# Patient Record
Sex: Female | Born: 1975 | Race: White | Hispanic: No | Marital: Married | Smoking: Former smoker
Health system: Southern US, Community
[De-identification: ages and names within clinical notes are randomized; demographics above are authoritative.]

---

## 2003-06-18 ENCOUNTER — Other Ambulatory Visit: Admission: RE | Admit: 2003-06-18 | Discharge: 2003-06-18 | Payer: Self-pay | Admitting: Family Medicine

## 2005-07-12 ENCOUNTER — Other Ambulatory Visit: Admission: RE | Admit: 2005-07-12 | Discharge: 2005-07-12 | Payer: Self-pay | Admitting: Obstetrics and Gynecology

## 2006-03-07 ENCOUNTER — Encounter (INDEPENDENT_AMBULATORY_CARE_PROVIDER_SITE_OTHER): Payer: Self-pay | Admitting: Specialist

## 2006-03-07 ENCOUNTER — Inpatient Hospital Stay (HOSPITAL_COMMUNITY): Admission: AD | Admit: 2006-03-07 | Discharge: 2006-03-09 | Payer: Self-pay | Admitting: Obstetrics and Gynecology

## 2006-07-17 ENCOUNTER — Other Ambulatory Visit: Admission: RE | Admit: 2006-07-17 | Discharge: 2006-07-17 | Payer: Self-pay | Admitting: Obstetrics and Gynecology

## 2007-07-22 ENCOUNTER — Ambulatory Visit (HOSPITAL_COMMUNITY): Admission: RE | Admit: 2007-07-22 | Discharge: 2007-07-22 | Payer: Self-pay | Admitting: Obstetrics and Gynecology

## 2011-04-20 NOTE — H&P (Signed)
NAMEARLESIA, Kathryn Schaefer NO.:  [id number]   MEDICAL RECORD NO.:  [id number]          PATIENT TYPE:  MAT   LOCATION:  MATC                          [facility]:  [facility]   PHYSICIAN:  Osborn Coho, M.D.   DATE OF BIRTH:  1976-06-04   DATE OF ADMISSION:  03/07/2006  DATE OF DISCHARGE:                                HISTORY & PHYSICAL   This is a 35 year old gravida 1, para 0 at 40-1/7 weeks who presents with  painful contractions for several hours.  She reports Pelzel blood show and  positive fetal movement.  Pregnancy has been followed by the M.D. 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   OB HISTORY:  Patient is a primigravida.   ALLERGIES:  None except for MILK.   MEDICAL HISTORY:  1.  Patient had an abnormal Pap in 1999 followed by normal ones.  2.  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  3.  History of anemia.  4.  History of depression for which she uses Zoloft.   SURGICAL HISTORY:  Remarkable for repair of a cut on her left foot.   FAMILY HISTORY:  Remarkable for grandfather with heart disease and  hypertension.  Aunt with hypertension.  Mother with varicose veins.  Aunt  with anemia.  Grandfather with emphysema.  Grandmother with diabetes.   GENETIC HISTORY:  Remarkable for a niece of the father with Turner's  syndrome.   SOCIAL HISTORY:  Patient is married to [employer] who is involved and  supportive.  She is of the [place] faith.  She denies any alcohol,  tobacco, or drug use.   PRENATAL LABORATORIES:  Hemoglobin 14.7, platelets 266.  Blood type O+.  Antibody screen negative.  RPR nonreactive.  Rubella immune.  Hepatitis  negative.  HIV negative.  Pap test normal.  Gonorrhea negative.  Chlamydia  negative.  HSV-I positive.  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  Cystic fibrosis negative.   HISTORY OF CURRENT PREGNANCY:  Patient entered care at 9 weeks.  She had an  ultrasound to confirm dates at that time.  She had an anatomy ultrasound at  19 weeks which was normal except for bilateral pyelectasis and toxo titers  were negative.  She had a normal Glucola.  She had an ultrasound at 35 weeks  showing resolved pyelectasis and 88th percentile growth.  Group B Strep was  negative at term.   OBJECTIVE:  VITAL SIGNS:  Stable, afebrile.  HEENT:  Within normal limits.  Thyroid normal, not enlarged.  CHEST:  Clear to auscultation.  HEART:  Regular rate and rhythm.  ABDOMEN:  Gravid at 40 cm.  Vertex to Leopold's.  EFM shows reactive fetal  heart rate with contractions every one and a half minutes.  There have been  two variable decelerations with contractions to the 120s.  PELVIC:  Cervix is 3, 90, -1, vertex, and bulging membranes.  EXTREMITIES:  Within normal limits.   ASSESSMENT:  1.  Intrauterine pregnancy at 40-1/7 weeks.  2.  Early active labor.  3.  Variable decelerations.   PLAN:  1.  Admit to birthing suites.  Dr. Su Hilt notified.  2.  Routine M.D. orders.  3.  Desires epidural.      Elby Showers. Williams, C.N.M.      Osborn Coho, M.D.  Electronically Signed    MLW/MEDQ  D:  03/07/2006  T:  03/07/2006  Job:  [id number]

## 2011-04-20 NOTE — Discharge Summary (Signed)
[name], [name]             ACCOUNT NO.:  [id number]   MEDICAL RECORD NO.:  [id number]          PATIENT TYPE:  INP   LOCATION:  9147                          [facility]  [facility]   PHYSICIAN:  Naima A. Dillard, M.D. DATE OF BIRTH:  [birth year]   DATE OF ADMISSION:  03/07/2006  DATE OF DISCHARGE:                                 DISCHARGE SUMMARY   ADMISSION DIAGNOSES:  1.  Intrauterine pregnancy at term.  2.  Early active labor.  3.  Variable decelerations.   DISCHARGE DIAGNOSES:  1.  Intrauterine pregnancy at term.  2.  Early active labor.  3.  Variable decelerations.  4.  Nonreassuring fetal heart tones.   PROCEDURE:  Primary low-transverse cesarean section.   HOSPITAL COURSE:  Ms. Kathryn Schaefer is a 35 year old primigravida who was admitted  at 40-1/7 weeks with contractions. The pregnancy has been followed by  [facility] MD service and had been remarkable for:  1.  Unsure last menstrual period.  2.  History of depression.  3.  Toxo risk.  4.  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  5.  Bilateral pyelectasis, now resolved.  6.  B-strep negative.   Upon admission her cervix was 3 cm, 90%, vertex -1 with intact membranes.  Her fetal heart rate was tracing was reactive, but she had had a variable  decelerations during the contractions. She was admitted and given an  epidural for pain. Her membranes were ruptured at approximately 2 p.m. that  afternoon when her cervix was 5 cm. There was thick particulate meconium  stained fluid. She continued to have variable decelerations. Her blood  pressure proceeded to drop and she was given ephedrine and fluid bolus. Her  vaginal exam was unchanged. She then proceeded to have recurrent late  decelerations and due to the fetal heart rate tracing a C-section was  recommended by Dr. Su Hilt. The patient and her husband were in agreement  and elected to proceed. The patient was a viable female infant by the name  of Kathryn Schaefer, with a weight of 7  pounds 14 ounces and Apgars of 9 and 9. The  infant was doing well and was taken to the full-term nursery in good  condition. The patient tolerated the rest of the cesarean section well and  was taken to the recovery room. By postoperative day one, her vital signs  were stable, her hemoglobin was 9.6 and had been 13.0 preoperatively. She  was breastfeeding and also supplementing occasionally with a bottle. By  postoperative day she was feeling well and desired to go home. The infant  was discharged from the nursery, breast feeding going well. Her vital signs  were stable.   DISCHARGE INSTRUCTIONS:  Per [facility] handout.   DISCHARGE MEDICATIONS:  1.  Motrin 600 mg one p.o. q.6h. p.r.n. pain.  2.  Tylox one to two p.o. q.3-4h. p.r.n. pain.   DISCHARGE FOLLOWUP:  [facility] in six weeks or as needed.      Kathryn Schaefer, C.N.M.      Naima A. Normand Sloop, M.D.  Electronically Signed    KS/MEDQ  D:  03/09/2006  T:  03/09/2006  Job:  [id number]

## 2011-04-20 NOTE — Op Note (Signed)
Kathryn Schaefer, Kathryn Schaefer             ACCOUNT NO.:  [id number]   MEDICAL RECORD NO.:  [id number]          PATIENT TYPE:  INP   LOCATION:  9147                          [facility]   PHYSICIAN:  Osborn Coho, M.D.   DATE OF BIRTH:  1976/10/01   DATE OF PROCEDURE:  03/07/2006  DATE OF DISCHARGE:                                 OPERATIVE REPORT   PREOP DIAGNOSES:  1.  Term intrauterine pregnancy.  2.  Labor.  3.  Nonreassuring fetal heart tracing.   POSTOPERATIVE DIAGNOSES:  1.  Term intrauterine pregnancy.  2.  Labor.  3.  Nonreassuring fetal heart tracing.   PROCEDURE:  Primary low transverse C-section via Pfannenstiel skin incision.   ANESTHESIA:  Epidural.   ATTENDING:  Dr. Osborn Coho.   ASSISTANT:  Wynelle Bourgeois, C.N.M.   FLUIDS:  2500 mL.   ESTIMATED BLOOD LOSS:  600 mL   URINE OUTPUT:  500 mL of clear urine.   FINDINGS:  Live female infant with Apgars of 9 at one minute and 9 at five  minutes in [place].  Normal-appearing bilateral ovaries and  fallopian tubes.   COMPLICATIONS:  None.   PROCEDURE:  The patient was taken to the operating room after risks,  benefits, alternatives were reviewed with the patient.  The patient  verbalized understanding and consent signed and witnessed.  The patient was  prepped and draped in the normal sterile fashion and a Pfannenstiel skin  incision made and carried out to the underlying layer of fascia with  scalpel.  The fascia was excised bilaterally in the midline and extended  bilaterally with the Mayo scissors.  Kocher clamps were placed on the  superior aspect of fascial incision and the rectus muscle excised from the  fascia.  The same was done on the inferior aspect of the fascial incision.  The muscles separated in midline and the peritoneum entered bluntly.  Bladder blade was placed.  The bladder flap created with the Metzenbaum  scissors.  Uterine incision was made with a scalpel and extended bilaterally  with the bandage scissors.  Infant was delivered without difficulty and upon  delivery of the head in vertex presentation, the oropharynx and nasopharynx  were DeLee suctioned.  A body cord with approximated to one and a half turns  around the body was noted.  The infant was handed to the waiting  pediatricians after cord clamped and cut and cefoxitin was administered.  The placenta was removed via fundal massage and the uterus cleared of all  clots and debris.  The uterine incision was repaired with 0 Vicryl in a  running locked fashion and a second imbricating layer was performed.  The  intra-abdominal cavity was copiously irrigated and adnexal findings as noted  above.  The peritoneum was repaired with 2-0 chromic in a running fashion  and the fascia was repaired with 0 Vicryl in a running fashion.  The  subcutaneous tissue was irrigated and made hemostatic with the Bovie.  The  subcutaneous tissue was reapproximated using 2-0 plain via four interrupted  stitches.  The skin was closed with 3-0 Monocryl  via a subcuticular stitch.  Half inch Steri-Strips were applied.  Pressure dressing applied as well.  Sponge, lap and needle count was correct.  The patient tolerated procedure  well and is currently awaiting transfer to the recovery room in good  condition.      Osborn Coho, M.D.  Electronically Signed     AR/MEDQ  D:  03/07/2006  T:  03/08/2006  Job:  [id number]

## 2013-11-12 ENCOUNTER — Other Ambulatory Visit: Payer: Self-pay | Admitting: Nurse Practitioner

## 2013-11-12 DIAGNOSIS — E049 Nontoxic goiter, unspecified: Secondary | ICD-10-CM

## 2013-11-13 ENCOUNTER — Ambulatory Visit
Admission: RE | Admit: 2013-11-13 | Discharge: 2013-11-13 | Disposition: A | Discharge status: Home / Self Care | Payer: 59 | Source: Ambulatory Visit | Attending: Nurse Practitioner | Admitting: Nurse Practitioner

## 2013-11-13 DIAGNOSIS — E049 Nontoxic goiter, unspecified: Secondary | ICD-10-CM

## 2013-11-17 ENCOUNTER — Other Ambulatory Visit: Payer: Self-pay

## 2014-04-07 ENCOUNTER — Ambulatory Visit (INDEPENDENT_AMBULATORY_CARE_PROVIDER_SITE_OTHER): Payer: 59 | Admitting: Endocrinology

## 2014-04-07 ENCOUNTER — Encounter: Payer: Self-pay | Admitting: Endocrinology

## 2014-04-07 VITALS — BP 112/76 | HR 97 | Temp 98.5°F | Ht 68.5 in | Wt 191.0 lb

## 2014-04-07 DIAGNOSIS — E042 Nontoxic multinodular goiter: Secondary | ICD-10-CM

## 2014-04-07 DIAGNOSIS — E282 Polycystic ovarian syndrome: Secondary | ICD-10-CM

## 2014-04-07 DIAGNOSIS — E079 Disorder of thyroid, unspecified: Secondary | ICD-10-CM

## 2014-04-07 MED ORDER — EFLORNITHINE HCL 13.9 % EX CREA: 1.0000 "application " | TOPICAL_CREAM | Freq: Every day | CUTANEOUS | Status: AC

## 2014-04-07 MED ORDER — METFORMIN HCL ER 500 MG PO TB24: 500.0000 mg | ORAL_TABLET | Freq: Every day | ORAL | Status: DC

## 2014-04-07 MED ORDER — DEXAMETHASONE 1 MG PO TABS: ORAL_TABLET | ORAL | Status: DC

## 2014-04-07 NOTE — Progress Notes (Signed)
Subjective:    Patient ID: Kathryn Schaefer, female    DOB: 05/26/1976, 38 y.o.   MRN: [id number]  HPI Pt had menarche at age 57.  She had regular menses. She is G1P1.  She has no h/o infertility.  She reports 2 years ago,she developed moderate hair loss on the head, and assoc excess body hair.  She has an IUD, and does not want any future pregnancy. She was rx'ed aldactone, but she says it has not helped any symptoms yet.   No past medical history on file.  No past surgical history on file.  History   Social History  . Marital Status: Married    Spouse Name: N/A    Number of Children: N/A  . Years of Education: N/A   Occupational History  . Not on file.   Social History Main Topics  . Smoking status: Former Research scientist (life sciences)  . Smokeless tobacco: Not on file  . Alcohol Use: Yes  . Drug Use: Not on file  . Sexual Activity: Not on file   Other Topics Concern  . Not on file   Social History Narrative  . No narrative on file    No current outpatient prescriptions on file prior to visit.   No current facility-administered medications on file prior to visit.    Not on File  No family history on file. PCOS: mother BP 112/76  Pulse 97  Temp(Src) 98.5 F (36.9 C) (Oral)  Ht 5' 8.5" (1.74 m)  Wt 191 lb (86.637 kg)  BMI 28.62 kg/m2  SpO2 97%  Review of Systems  HENT: Negative for voice change.   Eyes: Negative for visual disturbance.  Respiratory: Negative for shortness of breath.   Cardiovascular: Positive for leg swelling. Negative for chest pain.  Endocrine: Positive for cold intolerance.  Musculoskeletal: Negative for back pain.  Skin: Negative for rash.  Allergic/Immunologic: Positive for environmental allergies.  Neurological: Negative for numbness.  Hematological: Bruises/bleeds easily.  Psychiatric/Behavioral: Positive for dysphoric mood.  She has fatigue, facial acne, and weight gain. Since she has had the IUD, menses are very light.      Objective:   Physical Exam VS: see vs page GEN: no distress HEAD: head: no deformity eyes: no periorbital swelling, no proptosis external nose and ears are normal mouth: no lesion seen NECK: supple, thyroid is not enlarged CHEST WALL: no deformity LUNGS:  Clear to auscultation CV: reg rate and rhythm, no murmur ABD: abdomen is soft, nontender.  no hepatosplenomegaly.  not distended.  no hernia.  No striae. MUSCULOSKELETAL: muscle bulk and strength are grossly normal.  no obvious joint swelling.  gait is normal and steady EXTEMITIES: no deformity.  no ulcer on the feet.  feet are of normal color and temp.  no edema PULSES: dorsalis pedis intact bilat.  no carotid bruit NEURO:  cn 2-12 grossly intact.   readily moves all 4's.  sensation is intact to touch on the feet SKIN:  Normal texture and temperature.  No rash or suspicious lesion is visible.  Mild terminal hair and acne on the face.  NODES:  None palpable at the neck.   PSYCH: alert, well-oriented.  Does not appear anxious nor depressed.   outside test results are reviewed: Testosterone=59 (but free T=7.2--slightly high) A1c=5.4 Cortisol (am after decadron)=2.5 Lab Results  Component Value Date   TSH 0.33* 04/08/2014      Assessment & Plan:  Weight gain: no endocrine cause is found. Apparent h/o hypothyroidism: overreplaced. PCO: she needs  increased rx.

## 2014-04-07 NOTE — Patient Instructions (Addendum)
you should do a "dexamethasone suppression test."  for this, you would take dexamethasone 1 mg at 10 pm, then come in for a "cortisol" blood test the next morning before 9 am.  you do not need to be fasting for this test.  Please add "metformin," and a skin cream:  i have sent prescriptions to your pharmacy.   Please come back for a follow-up appointment in 6 months.

## 2014-04-08 ENCOUNTER — Encounter: Payer: Self-pay | Admitting: Endocrinology

## 2014-04-08 ENCOUNTER — Other Ambulatory Visit (INDEPENDENT_AMBULATORY_CARE_PROVIDER_SITE_OTHER): Payer: 59

## 2014-04-08 DIAGNOSIS — E079 Disorder of thyroid, unspecified: Secondary | ICD-10-CM

## 2014-04-08 DIAGNOSIS — E042 Nontoxic multinodular goiter: Secondary | ICD-10-CM

## 2014-04-08 LAB — BASIC METABOLIC PANEL
BUN: 8 mg/dL (ref 6–23)
CO2: 26 mEq/L (ref 19–32)
Calcium: 9.3 mg/dL (ref 8.4–10.5)
Chloride: 105 mEq/L (ref 96–112)
Creatinine, Ser: 1.2 mg/dL (ref 0.4–1.2)
GFR: 53.72 mL/min — ABNORMAL LOW (ref >60.00)
Glucose, Bld: 87 mg/dL (ref 70–99)
Potassium: 4.1 mEq/L (ref 3.5–5.1)
Sodium: 137 mEq/L (ref 135–145)

## 2014-04-08 LAB — T3, FREE: T3, Free: 2.4 pg/mL (ref 2.3–4.2)

## 2014-04-08 LAB — T4, FREE: Free T4: 0.64 ng/dL (ref 0.60–1.60)

## 2014-04-08 LAB — TSH: TSH: 0.33 u[IU]/mL — ABNORMAL LOW (ref 0.35–4.50)

## 2014-04-08 LAB — CORTISOL: Cortisol, Plasma: 2.5 ug/dL

## 2014-04-09 ENCOUNTER — Other Ambulatory Visit: Payer: Self-pay | Admitting: Endocrinology

## 2014-04-09 DIAGNOSIS — E282 Polycystic ovarian syndrome: Secondary | ICD-10-CM | POA: Insufficient documentation

## 2014-04-09 MED ORDER — ONDANSETRON HCL 4 MG PO TABS: 4.0000 mg | ORAL_TABLET | Freq: Three times a day (TID) | ORAL | Status: DC | PRN

## 2014-07-08 ENCOUNTER — Other Ambulatory Visit: Payer: Self-pay | Admitting: *Deleted

## 2014-07-08 ENCOUNTER — Other Ambulatory Visit (INDEPENDENT_AMBULATORY_CARE_PROVIDER_SITE_OTHER): Payer: 59

## 2014-07-08 DIAGNOSIS — E559 Vitamin D deficiency, unspecified: Secondary | ICD-10-CM

## 2014-07-08 DIAGNOSIS — E538 Deficiency of other specified B group vitamins: Secondary | ICD-10-CM

## 2014-07-08 DIAGNOSIS — D509 Iron deficiency anemia, unspecified: Secondary | ICD-10-CM

## 2014-07-08 DIAGNOSIS — E042 Nontoxic multinodular goiter: Secondary | ICD-10-CM

## 2014-07-08 DIAGNOSIS — E282 Polycystic ovarian syndrome: Secondary | ICD-10-CM

## 2014-07-08 LAB — COMPREHENSIVE METABOLIC PANEL
ALT: 23 U/L (ref 0–35)
AST: 17 U/L (ref 0–37)
Albumin: 3.9 g/dL (ref 3.5–5.2)
Alkaline Phosphatase: 44 U/L (ref 39–117)
BUN: 9 mg/dL (ref 6–23)
CO2: 25 mEq/L (ref 19–32)
Calcium: 9 mg/dL (ref 8.4–10.5)
Chloride: 103 mEq/L (ref 96–112)
Creatinine, Ser: 0.8 mg/dL (ref 0.4–1.2)
GFR: 80.19 mL/min (ref >60.00)
Glucose, Bld: 85 mg/dL (ref 70–99)
Potassium: 3.9 mEq/L (ref 3.5–5.1)
Sodium: 136 mEq/L (ref 135–145)
Total Bilirubin: 0.5 mg/dL (ref 0.2–1.2)
Total Protein: 6.7 g/dL (ref 6.0–8.3)

## 2014-07-08 LAB — IBC PANEL
Iron: 124 ug/dL (ref 42–145)
Saturation Ratios: 38.4 % (ref 20.0–50.0)
Transferrin: 230.5 mg/dL (ref 212.0–360.0)

## 2014-07-08 LAB — T4, FREE: Free T4: 1.21 ng/dL (ref 0.60–1.60)

## 2014-07-08 LAB — TSH: TSH: 0.04 u[IU]/mL — ABNORMAL LOW (ref 0.35–4.50)

## 2014-07-09 ENCOUNTER — Encounter: Payer: Self-pay | Admitting: Endocrinology

## 2014-07-09 ENCOUNTER — Other Ambulatory Visit: Payer: Self-pay | Admitting: Endocrinology

## 2014-07-09 LAB — VITAMIN B12: Vitamin B-12: >1500 pg/mL — ABNORMAL HIGH (ref 211–911)

## 2014-07-09 LAB — TESTOSTERONE: Testosterone: 39.19 ng/dL (ref 15.00–40.00)

## 2014-07-09 MED ORDER — LEVOTHYROXINE SODIUM 50 MCG PO TABS: 50.0000 ug | ORAL_TABLET | Freq: Every day | ORAL | Status: DC

## 2014-07-13 LAB — VITAMIN D 1,25 DIHYDROXY
Vitamin D 1, 25 (OH)2 Total: 39 pg/mL (ref 18–72)
Vitamin D2 1, 25 (OH)2: <8 pg/mL
Vitamin D3 1, 25 (OH)2: 39 pg/mL

## 2014-07-21 ENCOUNTER — Other Ambulatory Visit: Payer: Self-pay | Admitting: Endocrinology

## 2014-07-21 DIAGNOSIS — E079 Disorder of thyroid, unspecified: Secondary | ICD-10-CM

## 2014-07-21 DIAGNOSIS — E282 Polycystic ovarian syndrome: Secondary | ICD-10-CM

## 2014-07-22 ENCOUNTER — Other Ambulatory Visit (INDEPENDENT_AMBULATORY_CARE_PROVIDER_SITE_OTHER): Payer: 59

## 2014-07-22 ENCOUNTER — Other Ambulatory Visit: Payer: Self-pay | Admitting: Endocrinology

## 2014-07-22 DIAGNOSIS — E079 Disorder of thyroid, unspecified: Secondary | ICD-10-CM

## 2014-07-22 DIAGNOSIS — E282 Polycystic ovarian syndrome: Secondary | ICD-10-CM

## 2014-07-22 LAB — T4, FREE: Free T4: 0.89 ng/dL (ref 0.60–1.60)

## 2014-07-22 LAB — HEMOGLOBIN A1C: Hgb A1c MFr Bld: 5.4 % (ref 4.6–6.5)

## 2014-07-22 LAB — TSH: TSH: 0.07 u[IU]/mL — ABNORMAL LOW (ref 0.35–4.50)

## 2014-07-22 LAB — T3, FREE: T3, Free: 2.7 pg/mL (ref 2.3–4.2)

## 2014-07-22 MED ORDER — LEVOTHYROXINE SODIUM 25 MCG PO TABS: 25.0000 ug | ORAL_TABLET | Freq: Every day | ORAL | Status: AC

## 2014-07-25 LAB — T3, REVERSE: T3, Reverse: 14 ng/dL (ref 8–25)

## 2014-08-02 ENCOUNTER — Encounter: Payer: Self-pay | Admitting: Endocrinology

## 2014-08-02 ENCOUNTER — Other Ambulatory Visit: Payer: Self-pay | Admitting: Endocrinology

## 2014-08-02 MED ORDER — METFORMIN HCL ER 500 MG PO TB24: ORAL_TABLET | ORAL | Status: AC

## 2014-09-02 ENCOUNTER — Other Ambulatory Visit (INDEPENDENT_AMBULATORY_CARE_PROVIDER_SITE_OTHER): Payer: 59

## 2014-09-02 ENCOUNTER — Other Ambulatory Visit: Payer: Self-pay

## 2014-09-02 DIAGNOSIS — E079 Disorder of thyroid, unspecified: Secondary | ICD-10-CM

## 2014-09-02 LAB — TSH: TSH: 0.41 u[IU]/mL (ref 0.35–4.50)

## 2014-09-02 LAB — T3, FREE: T3, Free: 3.3 pg/mL (ref 2.3–4.2)

## 2014-09-02 LAB — T4, FREE: Free T4: 0.88 ng/dL (ref 0.60–1.60)

## 2015-01-26 ENCOUNTER — Other Ambulatory Visit: Payer: Self-pay | Admitting: Internal Medicine

## 2015-01-26 DIAGNOSIS — E221 Hyperprolactinemia: Secondary | ICD-10-CM

## 2015-01-26 DIAGNOSIS — D352 Benign neoplasm of pituitary gland: Secondary | ICD-10-CM

## 2015-01-30 ENCOUNTER — Ambulatory Visit
Admission: RE | Admit: 2015-01-30 | Discharge: 2015-01-30 | Disposition: A | Discharge status: Home / Self Care | Payer: PRIVATE HEALTH INSURANCE | Source: Ambulatory Visit | Attending: Internal Medicine | Admitting: Internal Medicine

## 2015-01-30 DIAGNOSIS — E221 Hyperprolactinemia: Secondary | ICD-10-CM

## 2015-01-30 DIAGNOSIS — D352 Benign neoplasm of pituitary gland: Secondary | ICD-10-CM

## 2015-01-30 MED ORDER — GADOBENATE DIMEGLUMINE 529 MG/ML IV SOLN
9.0000 mL | Freq: Once | INTRAVENOUS | Status: AC | PRN
  Administered 2015-01-30: 9 mL via INTRAVENOUS

## 2016-02-27 ENCOUNTER — Other Ambulatory Visit: Payer: Self-pay | Admitting: Internal Medicine

## 2016-02-27 DIAGNOSIS — D352 Benign neoplasm of pituitary gland: Secondary | ICD-10-CM

## 2016-03-06 ENCOUNTER — Other Ambulatory Visit: Payer: PRIVATE HEALTH INSURANCE

## 2016-03-12 ENCOUNTER — Ambulatory Visit
Admission: RE | Admit: 2016-03-12 | Discharge: 2016-03-12 | Disposition: A | Discharge status: Home / Self Care | Payer: BLUE CROSS/BLUE SHIELD | Source: Ambulatory Visit | Attending: Internal Medicine | Admitting: Internal Medicine

## 2016-03-12 DIAGNOSIS — D352 Benign neoplasm of pituitary gland: Secondary | ICD-10-CM

## 2016-03-12 MED ORDER — GADOBENATE DIMEGLUMINE 529 MG/ML IV SOLN
10.0000 mL | Freq: Once | INTRAVENOUS | Status: AC | PRN
  Administered 2016-03-12: 10 mL via INTRAVENOUS

## 2020-03-04 ENCOUNTER — Ambulatory Visit: Payer: BC Managed Care – PPO | Attending: Internal Medicine

## 2020-03-04 DIAGNOSIS — Z23 Encounter for immunization: Secondary | ICD-10-CM

## 2020-03-04 NOTE — Progress Notes (Signed)
   Covid-19 Vaccination Clinic  Name:  Kathryn Schaefer    MRN: [id number] DOB: Apr 07, 1976  03/04/2020  [name] was observed post Covid-19 immunization for 15 minutes without incident. She was provided with Vaccine Information Sheet and instruction to access the V-Safe system.   [name] was instructed to call 911 with any severe reactions post vaccine: Marland Kitchen Difficulty breathing  . Swelling of face and throat  . A fast heartbeat  . A bad rash all over body  . Dizziness and weakness   Immunizations Administered    Name Date Dose VIS Date Route   Pfizer COVID-19 Vaccine 03/04/2020 10:53 AM 0.3 mL 11/13/2019 Intramuscular   Manufacturer: [place]   Lot: [id number]   [place]: [id number]

## 2020-03-30 ENCOUNTER — Ambulatory Visit: Payer: BC Managed Care – PPO | Attending: Internal Medicine

## 2020-03-30 DIAGNOSIS — Z23 Encounter for immunization: Secondary | ICD-10-CM

## 2020-03-30 NOTE — Progress Notes (Signed)
   Covid-19 Vaccination Clinic  Name:  Kathryn Schaefer    MRN: [id number] DOB: 19-May-1976  03/30/2020  [name] was observed post Covid-19 immunization for 15 minutes without incident. She was provided with Vaccine Information Sheet and instruction to access the V-Safe system.   [name] was instructed to call 911 with any severe reactions post vaccine: Marland Kitchen Difficulty breathing  . Swelling of face and throat  . A fast heartbeat  . A bad rash all over body  . Dizziness and weakness   Immunizations Administered    Name Date Dose VIS Date [place] COVID-19 Vaccine 03/30/2020 12:24 PM 0.3 mL 01/27/2019 Intramuscular   Manufacturer: [place]   Lot: [id number]   [place]: [id number]

## 2020-04-21 ENCOUNTER — Ambulatory Visit (INDEPENDENT_AMBULATORY_CARE_PROVIDER_SITE_OTHER): Payer: Self-pay | Admitting: Plastic Surgery

## 2020-04-21 ENCOUNTER — Other Ambulatory Visit: Payer: Self-pay

## 2020-04-21 VITALS — BP 110/76 | HR 98 | Temp 97.3°F | Ht 69.0 in | Wt 235.6 lb

## 2020-04-21 DIAGNOSIS — Z411 Encounter for cosmetic surgery: Secondary | ICD-10-CM

## 2020-04-21 NOTE — Progress Notes (Signed)
   Referring Provider Josetta Huddle, MD [address] [place],  [place] [address]   CC:  Chief Complaint  Patient presents with  . Consult    earlobe repair      Kathryn Schaefer is an 44 y.o. female.  HPI: Patient is here to discuss a tear of her left earlobe.  It was initially stretched but finally fully tore through recently.  She would like to have it repaired.  No Known Allergies  Outpatient Encounter Medications as of 04/21/2020  Medication Sig Note  . acyclovir (ZOVIRAX) 200 MG capsule  04/07/2014: Received from: External Pharmacy  . acyclovir (ZOVIRAX) 400 MG tablet Take 800 mg by mouth 2 (two) times daily.   Marland Kitchen FLUoxetine (PROZAC) 40 MG capsule fluoxetine 40 mg capsule  TAKE 1 CAPSULE BY MOUTH ONCE DAILY FOR 30 DAYS   . citalopram (CELEXA) 10 MG tablet  04/07/2014: Received from: External Pharmacy  . Eflornithine HCl 13.9 % cream Apply 1 application topically at bedtime.   Marland Kitchen levothyroxine (SYNTHROID, LEVOTHROID) 25 MCG tablet Take 1 tablet (25 mcg total) by mouth daily before breakfast.   . metFORMIN (GLUCOPHAGE-XR) 500 MG 24 hr tablet 2 tabs daily   . spironolactone (ALDACTONE) 50 MG tablet  04/07/2014: Received from: External Pharmacy   No facility-administered encounter medications on file as of 04/21/2020.     No past medical history on file.  No family history on file.  Social History   Social History Narrative  . Not on file     Review of Systems General: Denies fevers, chills, weight loss CV: Denies chest pain, shortness of breath, palpitations  Physical Exam Vitals with BMI 04/21/2020 03/12/2016 01/30/2015  Height 5\' 9"  - -  Weight 235 lbs 10 oz 225 lbs 200 lbs  BMI 123XX123 - -  Systolic A999333 - -  Diastolic 76 - -  Pulse 98 - -    General:  No acute distress,  Alert and oriented, Non-Toxic, Normal speech and affect Exam shows complete tear of the left earlobe.  Assessment/Plan Patient has a complete tear of the left earlobe.  We discussed  repair.  We discussed the risks include bleeding, infection, damage to surrounding structures, need for additional procedures.  I discussed the low likelihood of a small notch at the rim of the earlobe.  I explained the procedure to her in the follow-up process.  We will plan to schedule this under local soon.  All of her questions were answered.  Cindra Presume 04/21/2020, 12:56 PM

## 2020-05-23 ENCOUNTER — Encounter: Payer: Self-pay | Admitting: Plastic Surgery

## 2020-05-23 ENCOUNTER — Other Ambulatory Visit: Payer: Self-pay

## 2020-05-23 ENCOUNTER — Ambulatory Visit (INDEPENDENT_AMBULATORY_CARE_PROVIDER_SITE_OTHER): Payer: Self-pay | Admitting: Plastic Surgery

## 2020-05-23 VITALS — BP 120/74 | HR 88 | Temp 97.3°F | Ht 69.0 in | Wt 236.0 lb

## 2020-05-23 DIAGNOSIS — Z411 Encounter for cosmetic surgery: Secondary | ICD-10-CM

## 2020-05-23 NOTE — Progress Notes (Signed)
Operative Note   DATE OF OPERATION: 05/23/2020  LOCATION:    SURGICAL DEPARTMENT: Plastic Surgery  PREOPERATIVE DIAGNOSES:  Left split ear lobe  POSTOPERATIVE DIAGNOSES:  same  PROCEDURE:  1. Excision and repair of left split ear lobe  SURGEON: Talmadge Coventry, MD  ANESTHESIA:  Local  COMPLICATIONS: None.   INDICATIONS FOR PROCEDURE:  The patient, Kathryn Schaefer is a 44 y.o. female born on June 26, 1976, is here for treatment of left split ear lobe MRN: [id number]  CONSENT:  Informed consent was obtained directly from the patient. Risks, benefits and alternatives were fully discussed. Specific risks including but not limited to bleeding, infection, hematoma, seroma, scarring, pain, infection, wound healing problems, and need for further surgery were all discussed. The patient did have an ample opportunity to have questions answered to satisfaction.   DESCRIPTION OF PROCEDURE:  Local anesthesia was administered. The patient's operative site was prepped and draped in a sterile fashion. A time out was performed and all information was confirmed to be correct.  The split was excised with an 11 blade.  Hemostasis was obtained.  Closure was done with combination of prolene and 5-0 fast gut sutures.    The patient tolerated the procedure well.  There were no complications.

## 2020-06-09 ENCOUNTER — Ambulatory Visit (INDEPENDENT_AMBULATORY_CARE_PROVIDER_SITE_OTHER): Payer: Self-pay | Admitting: Surgical

## 2020-06-09 ENCOUNTER — Encounter: Payer: Self-pay | Admitting: Surgical

## 2020-06-09 ENCOUNTER — Other Ambulatory Visit: Payer: Self-pay

## 2020-06-09 VITALS — BP 122/85 | HR 69 | Temp 96.9°F

## 2020-06-09 DIAGNOSIS — Z411 Encounter for cosmetic surgery: Secondary | ICD-10-CM

## 2020-06-09 NOTE — Progress Notes (Signed)
Patient is a 44 year old female here for follow-up after repair of left split earlobe on 05/23/2020 with Dr. Silverio Lay pace.  Patient reports she is doing well.  No complaints.  Some tenderness to palpation of left earlobe.  No erythema.  Prolene sutures in place.  On exam left foot earlobe has healed nicely, incision is C/C/I.  No erythema.  No drainage noted.  Prolene sutures in place.  Prolene sutures removed, patient tolerated this well.  No sign of infection.  Recommend applying Vaseline daily for the next 2 to 3 days.  Cover with sunscreen prevent discoloration when exposed to sun.  Follow-up scheduled for 3 to 4 months for repiercing of left earlobe.  Call with questions or concerns.

## 2020-09-14 ENCOUNTER — Other Ambulatory Visit: Payer: Self-pay

## 2020-09-14 ENCOUNTER — Encounter: Payer: Self-pay | Admitting: Plastic Surgery

## 2020-09-14 ENCOUNTER — Ambulatory Visit (INDEPENDENT_AMBULATORY_CARE_PROVIDER_SITE_OTHER): Payer: Self-pay | Admitting: Plastic Surgery

## 2020-09-14 VITALS — HR 66 | Temp 98.2°F

## 2020-09-14 DIAGNOSIS — Z411 Encounter for cosmetic surgery: Secondary | ICD-10-CM

## 2020-09-14 NOTE — Progress Notes (Signed)
Patient presents after her left earlobe repair for a torn earlobe.  This is all healed up nicely.  She is interested in repiercing.  Identified a spot just posterior to the scar which matches her contralateral piercing.  After alcohol prep for the skin the piercing gun was used to perform the piercing with no issue.  Patient tolerated this fine.  She is happy with the location afterwards.  I gave her instructions on how to manage the piercing and to try to leave it in place for several weeks to avoid having difficulty getting different earring through the hole.  All her questions were answered and we will see her again on an as-needed basis.

## 2020-10-20 ENCOUNTER — Other Ambulatory Visit: Payer: Self-pay | Admitting: Internal Medicine

## 2020-10-20 DIAGNOSIS — D352 Benign neoplasm of pituitary gland: Secondary | ICD-10-CM

## 2020-11-14 ENCOUNTER — Ambulatory Visit
Admission: RE | Admit: 2020-11-14 | Discharge: 2020-11-14 | Disposition: A | Discharge status: Home / Self Care | Payer: BC Managed Care – PPO | Source: Ambulatory Visit | Attending: Internal Medicine | Admitting: Internal Medicine

## 2020-11-14 ENCOUNTER — Other Ambulatory Visit: Payer: Self-pay

## 2020-11-14 DIAGNOSIS — D352 Benign neoplasm of pituitary gland: Secondary | ICD-10-CM

## 2020-11-14 MED ORDER — GADOBUTROL 1 MMOL/ML IV SOLN
7.0000 mL | Freq: Once | INTRAVENOUS | Status: AC | PRN
  Administered 2020-11-14: 7 mL via INTRAVENOUS

## 2021-04-15 IMAGING — MR MR HEAD WO/W CM
16 of 19 series · 36 of 48 positions shown · IV contrast (10 ML MULTIHANCE)
Comparison: MRI of the brain March 12, 2016.

CLINICAL DATA: Follow-up of pituitary tumor.

EXAM:
MRI HEAD WITHOUT AND WITH CONTRAST
TECHNIQUE: Multiplanar, multiecho pulse sequences of the brain and surrounding
structures were obtained without and with intravenous contrast.
CONTRAST:  7mL GADAVIST GADOBUTROL 1 MMOL/ML IV SOLN

[Series 2: t1_se_sag · sagittal · 5.0mm · 0.45mm/px · 1 of 19 slices shown]
[im 1/19]
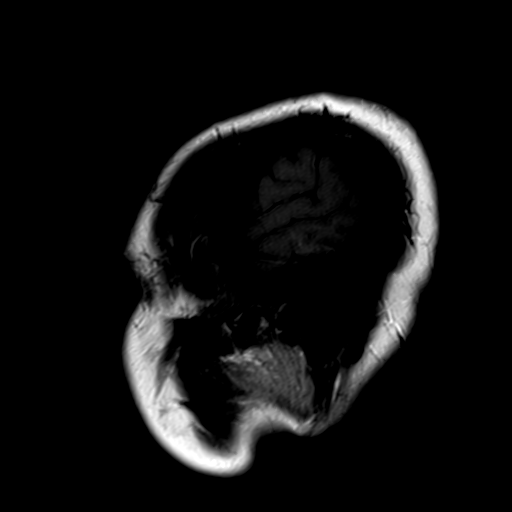

[Series 3: ep2d_diff_3 · axial · 3.0mm · 1.80mm/px · z∈[-53,+93]mm · 8 of 100 slices shown]
[im 1/100]
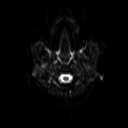
[im 12/100]
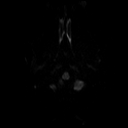
[im 34/100]
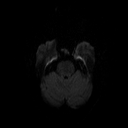
[im 45/100]
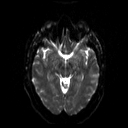
[im 56/100]
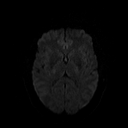
[im 67/100]
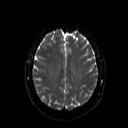
[im 89/100]
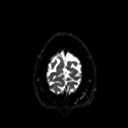
[im 100/100]
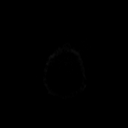

[Series 4: ep2d_diff_3_adc · axial · 3.0mm · 1.80mm/px · z∈[-53,+93]mm · 6 of 50 slices shown]
[im 1/50]
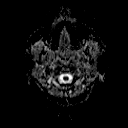
[im 10/50]
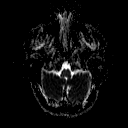
[im 20/50]
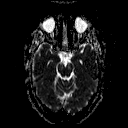
[im 30/50]
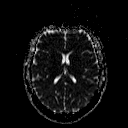
[im 40/50]
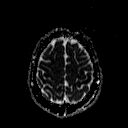
[im 50/50]
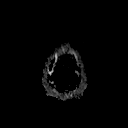

[Series 5: T2 · axial · 5.0mm · 0.45mm/px · z∈[-46,+90]mm · 3 of 22 slices shown]
[im 1/22]
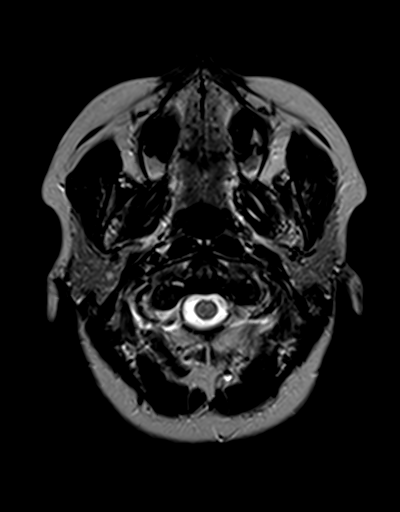
[im 11/22]
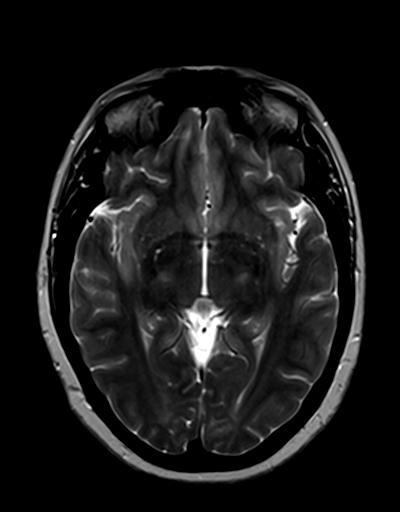
[im 22/22]
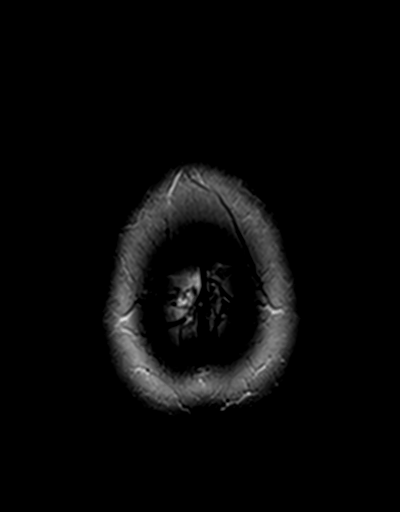

[Series 6: GRE · axial · 5.0mm · 0.45mm/px · z∈[-46,+90]mm · 3 of 22 slices shown]
[im 1/22]
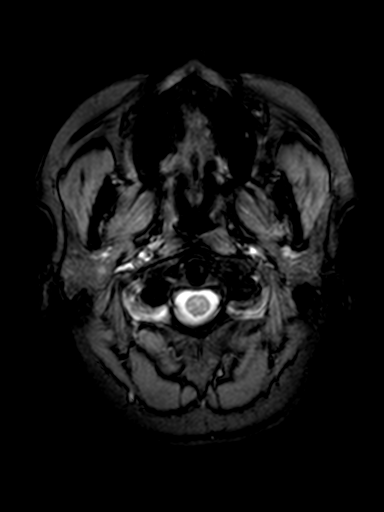
[im 11/22]
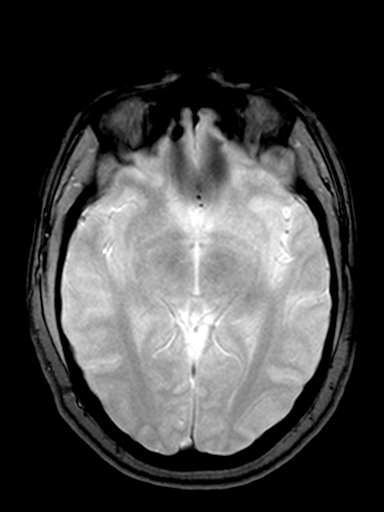
[im 22/22]
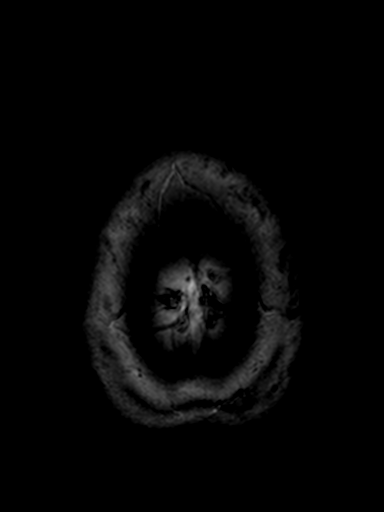

[Series 7: FLAIR · axial · 5.0mm · 0.45mm/px · z∈[-48,+88]mm · 3 of 22 slices shown]
[im 1/22]
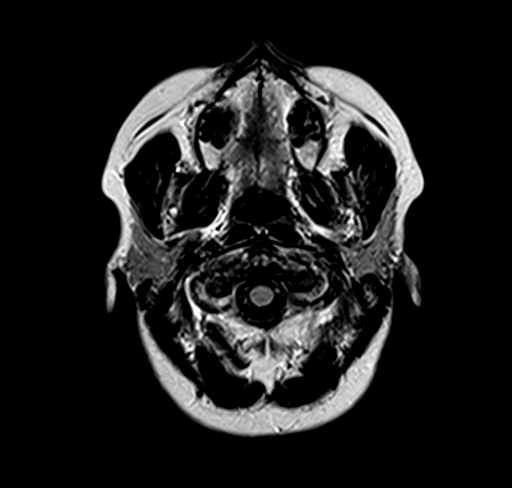
[im 11/22]
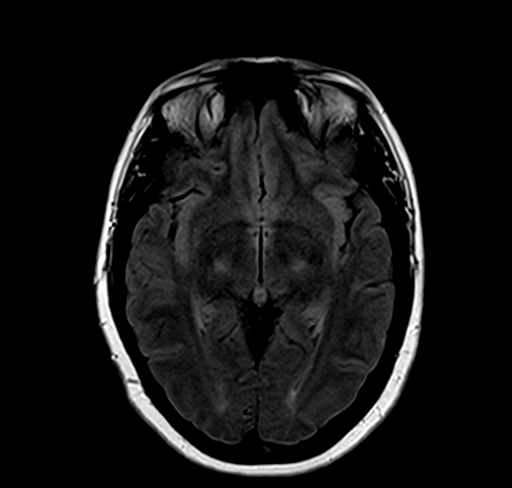
[im 22/22]
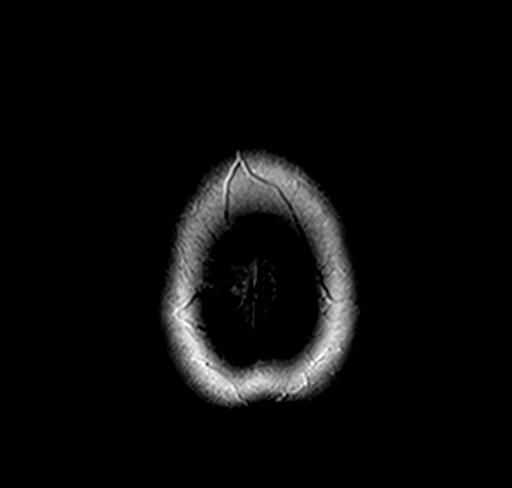

[Series 8: T1 · sagittal · 3.0mm · 0.35mm/px · 1 of 11 slices shown (1 of 2)]
[im 1/11]
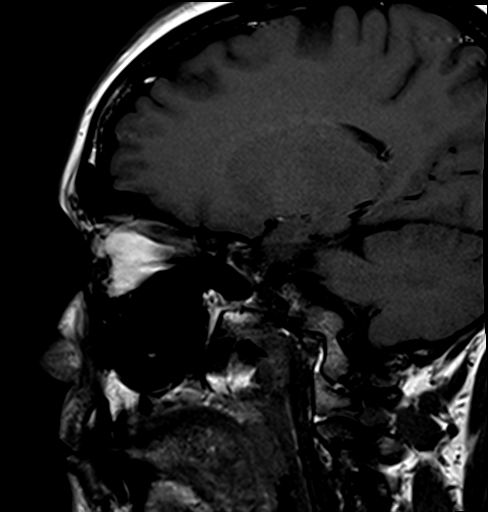

[Series 9: T1 · coronal · 3.0mm · 0.35mm/px · 1 of 11 slices shown (2 of 2)]
[im 1/11]
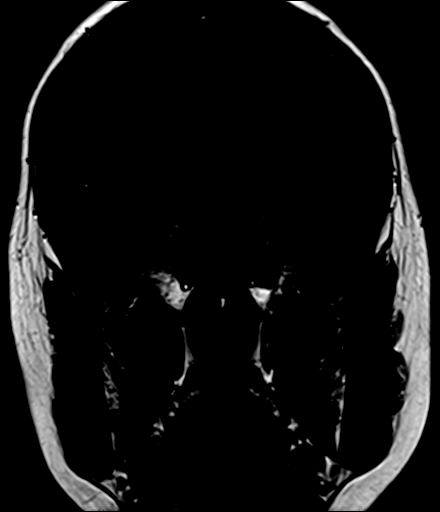

[Series 10: pre cor · coronal · non-contrast · 3.0mm · 0.35mm/px · 1 of 6 slices shown]
[im 1/6]
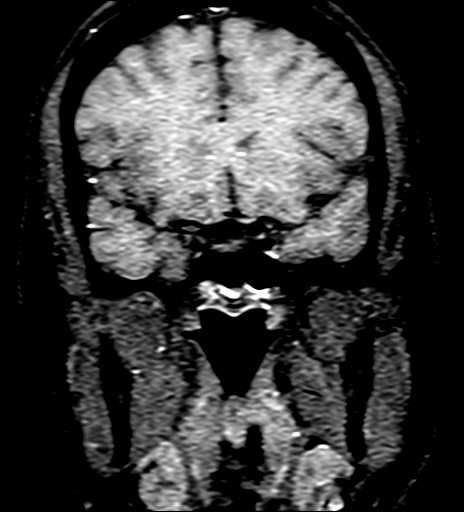

[Series 11: post cor dynamic · coronal · 3.0mm · 0.35mm/px · 1 of 6 slices shown (1 of 4)]
[im 1/6]
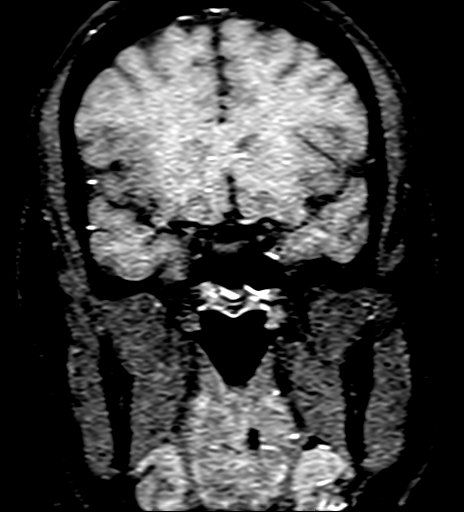

[Series 12: post cor dynamic · coronal · 3.0mm · 0.35mm/px · 1 of 6 slices shown (2 of 4)]
[im 1/6]
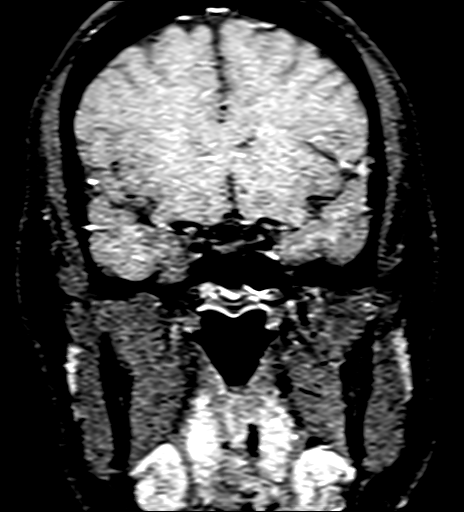

[Series 13: post cor dynamic · coronal · 3.0mm · 0.35mm/px · 1 of 6 slices shown (3 of 4)]
[im 1/6]
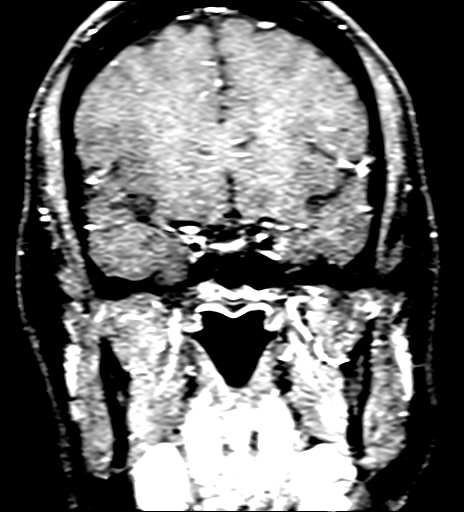

[Series 14: post cor dynamic · coronal · 3.0mm · 0.35mm/px · 1 of 6 slices shown (4 of 4)]
[im 1/6]
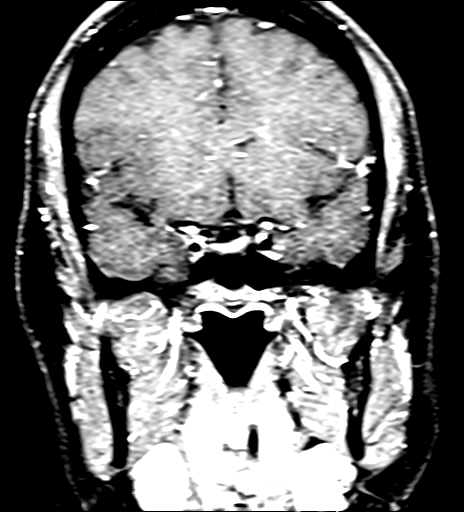

[Series 17: T1 post-contrast · coronal · 3.0mm · 0.35mm/px · 1 of 11 slices shown (1 of 3)]
[im 1/11]
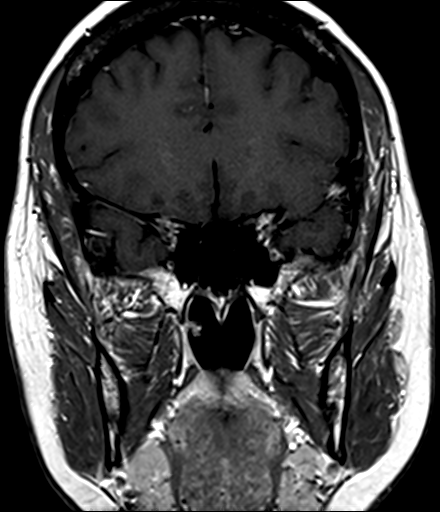

[Series 18: T1 post-contrast · sagittal · 3.0mm · 0.35mm/px · 1 of 11 slices shown (2 of 3)]
[im 1/11]
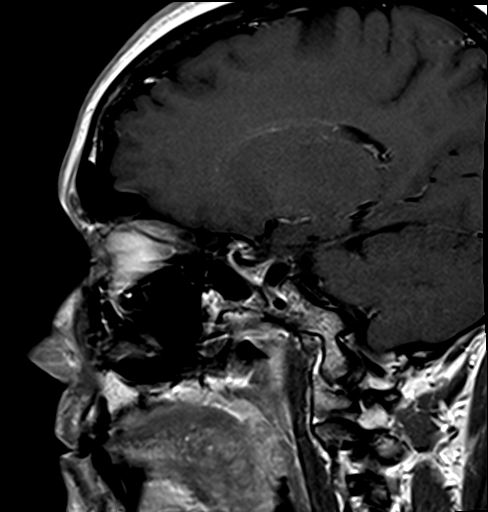

[Series 20: T1 post-contrast · coronal · 5.0mm · 0.69mm/px · 3 of 25 slices shown (3 of 3)]
[im 1/25]
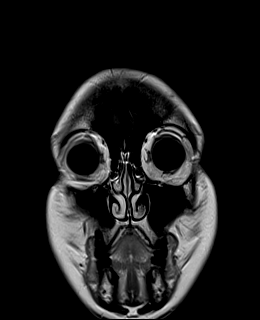
[im 13/25]
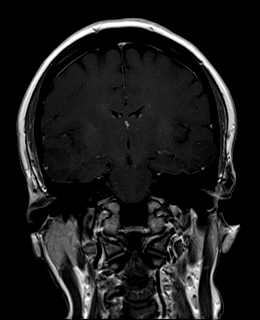
[im 25/25]
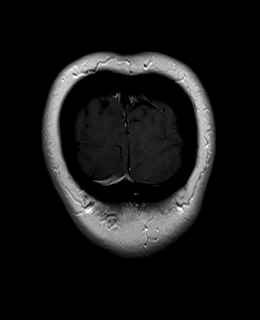

[36 of 48 positions shown; findings below may reference images not displayed]

FINDINGS: Brain: No acute infarction, hemorrhage, hydrocephalus or extra-axial
collection.

Stable appearance of 2 mm nonenhancing focus on the right side of
the anterior pituitary gland. Pituitary stalk is centered.
Suprasellar cistern is maintained. Cavernous sinuses are normal.

Vascular: Normal flow voids.

Skull and upper cervical spine: Normal marrow signal.

Sinuses/Orbits: Negative.
IMPRESSION: Stable appearance of 2 mm nonenhancing focus on the right side of
the anterior pituitary gland which may represent a cyst versus a
pituitary adenoma.
